# Patient Record
Sex: Male | Born: 1950 | Race: Black or African American | Hispanic: No | Marital: Single | Smoking: Never smoker
Health system: Southern US, Community
[De-identification: ages and names within clinical notes are randomized; demographics above are authoritative.]

## PROBLEM LIST (undated history)

## (undated) DIAGNOSIS — I10 Essential (primary) hypertension: Secondary | ICD-10-CM

## (undated) DIAGNOSIS — N400 Enlarged prostate without lower urinary tract symptoms: Secondary | ICD-10-CM

## (undated) HISTORY — PX: TONSILLECTOMY: SUR1361

## (undated) HISTORY — PX: CHOLECYSTECTOMY: SHX55

---

## 2008-02-10 ENCOUNTER — Encounter: Admission: RE | Admit: 2008-02-10 | Discharge: 2008-02-10 | Payer: Self-pay | Admitting: Chiropractic Medicine

## 2008-02-12 ENCOUNTER — Ambulatory Visit (HOSPITAL_COMMUNITY): Admission: RE | Admit: 2008-02-12 | Discharge: 2008-02-12 | Payer: Self-pay | Admitting: Chiropractic Medicine

## 2008-07-02 ENCOUNTER — Encounter: Admission: RE | Admit: 2008-07-02 | Discharge: 2008-07-02 | Payer: Self-pay | Admitting: Unknown Physician Specialty
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
## 2010-05-14 ENCOUNTER — Encounter: Payer: Self-pay | Admitting: Unknown Physician Specialty

## 2013-11-25 ENCOUNTER — Encounter (HOSPITAL_COMMUNITY): Payer: Self-pay | Admitting: Emergency Medicine

## 2013-11-25 ENCOUNTER — Emergency Department (HOSPITAL_COMMUNITY)
Admission: EM | Admit: 2013-11-25 | Discharge: 2013-11-25 | Disposition: A | Discharge status: Home / Self Care | Payer: Medicaid Other | Attending: Emergency Medicine | Admitting: Emergency Medicine

## 2013-11-25 DIAGNOSIS — N138 Other obstructive and reflux uropathy: Secondary | ICD-10-CM | POA: Insufficient documentation

## 2013-11-25 DIAGNOSIS — I10 Essential (primary) hypertension: Secondary | ICD-10-CM | POA: Diagnosis not present

## 2013-11-25 DIAGNOSIS — R339 Retention of urine, unspecified: Secondary | ICD-10-CM | POA: Insufficient documentation

## 2013-11-25 DIAGNOSIS — N401 Enlarged prostate with lower urinary tract symptoms: Principal | ICD-10-CM

## 2013-11-25 HISTORY — DX: Benign prostatic hyperplasia without lower urinary tract symptoms: N40.0

## 2013-11-25 HISTORY — DX: Essential (primary) hypertension: I10

## 2013-11-25 LAB — I-STAT CHEM 8, ED
BUN: 9 mg/dL (ref 6–23)
CHLORIDE: 101 meq/L (ref 96–112)
Calcium, Ion: 1.08 mmol/L — ABNORMAL LOW (ref 1.13–1.30)
Creatinine, Ser: 1.1 mg/dL (ref 0.50–1.35)
GLUCOSE: 101 mg/dL — AB (ref 70–99)
HEMATOCRIT: 50 % (ref 39.0–52.0)
HEMOGLOBIN: 17 g/dL (ref 13.0–17.0)
Potassium: 3.2 mEq/L — ABNORMAL LOW (ref 3.7–5.3)
Sodium: 137 mEq/L (ref 137–147)
TCO2: 20 mmol/L (ref 0–100)

## 2013-11-25 LAB — URINALYSIS, ROUTINE W REFLEX MICROSCOPIC
Bilirubin Urine: NEGATIVE
Glucose, UA: NEGATIVE mg/dL
Ketones, ur: NEGATIVE mg/dL
LEUKOCYTES UA: NEGATIVE
NITRITE: NEGATIVE
PROTEIN: NEGATIVE mg/dL
SPECIFIC GRAVITY, URINE: 1.015 (ref 1.005–1.030)
Urobilinogen, UA: 0.2 mg/dL (ref 0.0–1.0)
pH: 5.5 (ref 5.0–8.0)

## 2013-11-25 LAB — URINE MICROSCOPIC-ADD ON

## 2013-11-25 MED ORDER — LIDOCAINE HCL 2 % EX GEL
1.0000 "application " | Freq: Once | CUTANEOUS | Status: AC
  Administered 2013-11-25: 1 via URETHRAL
  Filled 2013-11-25: qty 20

## 2013-11-25 NOTE — ED Notes (Signed)
Per pt sts that he has been unable to void since this am. sts severe pain in groin area and penis. sts he feels like the urine goes to the tip of his penis and then stops. Pt very diaphoretic and rapid respirations.

## 2013-11-25 NOTE — Discharge Instructions (Signed)
Call your urologist to schedule follow-up in the next few days. Return to the ED for new concerns.

## 2013-11-25 NOTE — ED Provider Notes (Signed)
CSN: [id number]     Arrival date & time 11/25/13  1427 History   First MD Initiated Contact with Patient 11/25/13 1500     Chief Complaint  Patient presents with  . Urine Output     (Consider location/radiation/quality/duration/timing/severity/associated sxs/prior Treatment) The history is provided by the patient and medical records.   This is a 63 year old male with past medical history significant for hypertension enlarged prostate, presenting to the ED for urinary retention. Patient states he is less able to urinate around BM this morning. States he has a severe pain in his suprapubic region-- states "it feels like my bladder is going to explode".  Patient constantly has the sensation to urinate, but is unable to produce any urine. He has never had issues like this in the past will required catheterization.  Denies any recent flank pain, fever, chills, or sweats. No back pain, LE weakness, numbness, or paresthesias.  No difficulties ambulating.  Patient has no prior history of kidney stones.  Pt is followed by urology regularly at the [facility] in [place] by Dr. Eunice Blase.  Past Medical History  Diagnosis Date  . Enlarged prostate   . Hypertension    Past Surgical History  Procedure Laterality Date  . Cholecystectomy    . Tonsillectomy     History reviewed. No pertinent family history. History  Substance Use Topics  . Smoking status: Never Smoker   . Smokeless tobacco: Not on file  . Alcohol Use: No    Review of Systems  Genitourinary: Positive for difficulty urinating.  All other systems reviewed and are negative.     Allergies  Influenza vaccines and Statins  Home Medications   Prior to Admission medications   Not on File   BP 204/120  Pulse 93  Temp(Src) 98.3 F (36.8 C)  Resp 30  Wt 287 lb (130.182 kg)  SpO2 99%  Physical Exam  Nursing note and vitals reviewed. Constitutional: He is oriented to person, place, and time. He appears well-developed and  well-nourished.  Appears uncomfortable  HENT:  Head: Normocephalic and atraumatic.  Mouth/Throat: Oropharynx is clear and moist.  Eyes: Conjunctivae and EOM are normal. Pupils are equal, round, and reactive to light.  Neck: Normal range of motion.  Cardiovascular: Normal rate, regular rhythm and normal heart sounds.   Pulmonary/Chest: Effort normal and breath sounds normal. No respiratory distress. He has no wheezes.  Abdominal: Soft. Bowel sounds are normal. There is no tenderness. There is no guarding and no CVA tenderness.  "pressure" over suprapubic region but no focal tenderness or peritoneal signs  Genitourinary: Testes normal and penis normal. Circumcised.  Musculoskeletal: Normal range of motion.  Neurological: He is alert and oriented to person, place, and time.  AAOx3, answering questions appropriately; equal strength UE and LE bilaterally; CN grossly intact; moves all extremities appropriately without ataxia; no focal neuro deficits or facial asymmetry appreciated  Skin: Skin is warm and dry.  Psychiatric: He has a normal mood and affect.    ED Course  Procedures (including critical care time) Labs Review Labs Reviewed  URINALYSIS, ROUTINE W REFLEX MICROSCOPIC - Abnormal; Notable for the following:    Hgb urine dipstick LARGE (*)    All other components within normal limits  I-STAT CHEM 8, ED - Abnormal; Notable for the following:    Potassium 3.2 (*)    Glucose, Bld 101 (*)    Calcium, Ion 1.08 (*)    All other components within normal limits  URINE CULTURE  URINE MICROSCOPIC-ADD ON    Imaging Review No results found.   EKG Interpretation None      MDM   Final diagnoses:  Urinary retention   63 y.o. M with known enlarged prostate presenting with urinary retention.  Denies fever, chills, sweats, or hx kidney stones.  Pt followed by urology regularly at [facility], last seen on Thursday 11/19/13.  Bladder scan with 700+ cc urine in bladder.  Will place foley  cathter, obtain u/a and chem-8.  Pt hypertensive at this time, likely secondary due to pain.  Foley placed with 800+ cc return of yellow urine.  Pt states pain has completed resolved, he is feeling great at this time. Abdominal exam benign, no CVA tenderness. U/a without signs of infection, hematuria present likely from cath. Renal function WNL.  BP has returned to normal.  Urinary retention likely secondary to enlarged prostate.  No back pain or LE sx to suggest SCI.  Will leave foley catheter in place.  Pt will FU with his urologist this week.  Discussed plan with patient, he/she acknowledged understanding and agreed with plan of care.  Return precautions given for new or worsening symptoms.  Larene Pickett, PA-C 11/25/13 2330

## 2013-11-25 NOTE — ED Notes (Signed)
Assisted [place], [place] with changing of foley bag to leg bag and instructing pt on how to adjust the straps and empty the leg bag

## 2013-11-26 NOTE — ED Provider Notes (Signed)
Medical screening examination/treatment/procedure(s) were performed by non-physician practitioner and as supervising physician I was immediately available for consultation/collaboration.   EKG Interpretation None        Francine Graven, DO 11/26/13 2342

## 2013-11-27 LAB — URINE CULTURE
COLONY COUNT: NO GROWTH
Culture: NO GROWTH

## 2017-02-08 ENCOUNTER — Observation Stay (HOSPITAL_COMMUNITY)
Admission: EM | Admit: 2017-02-08 | Discharge: 2017-02-10 | Disposition: A | Discharge status: Home / Self Care | Payer: Medicaid Other | Attending: Family Medicine | Admitting: Family Medicine

## 2017-02-08 ENCOUNTER — Encounter (HOSPITAL_COMMUNITY): Payer: Self-pay

## 2017-02-08 DIAGNOSIS — K922 Gastrointestinal hemorrhage, unspecified: Secondary | ICD-10-CM | POA: Diagnosis not present

## 2017-02-08 DIAGNOSIS — Z79899 Other long term (current) drug therapy: Secondary | ICD-10-CM | POA: Insufficient documentation

## 2017-02-08 DIAGNOSIS — Z800 Family history of malignant neoplasm of digestive organs: Secondary | ICD-10-CM | POA: Diagnosis not present

## 2017-02-08 DIAGNOSIS — I10 Essential (primary) hypertension: Secondary | ICD-10-CM | POA: Diagnosis not present

## 2017-02-08 DIAGNOSIS — E876 Hypokalemia: Secondary | ICD-10-CM | POA: Insufficient documentation

## 2017-02-08 DIAGNOSIS — K5790 Diverticulosis of intestine, part unspecified, without perforation or abscess without bleeding: Secondary | ICD-10-CM | POA: Insufficient documentation

## 2017-02-08 DIAGNOSIS — N400 Enlarged prostate without lower urinary tract symptoms: Secondary | ICD-10-CM | POA: Insufficient documentation

## 2017-02-08 DIAGNOSIS — D62 Acute posthemorrhagic anemia: Secondary | ICD-10-CM | POA: Diagnosis not present

## 2017-02-08 DIAGNOSIS — K625 Hemorrhage of anus and rectum: Secondary | ICD-10-CM | POA: Diagnosis present

## 2017-02-08 MED ORDER — SODIUM CHLORIDE 0.9 % IV BOLUS (SEPSIS)
1000.0000 mL | Freq: Once | INTRAVENOUS | Status: AC
  Administered 2017-02-09: 1000 mL via INTRAVENOUS

## 2017-02-08 NOTE — ED Triage Notes (Signed)
Pt from home BIB [facility]. Pt has a Lower GI bleed hasn't had a appetite for a few days. Pt denise N/V or GI pain. Pt states he was going to the bathroom when he sat down and felt the stool very runny. When pt looked back he saw the whole toilet full of blood and some on the floor with clots. Pt is axox4 at this time.

## 2017-02-08 NOTE — ED Provider Notes (Signed)
[facility]Y DEPARTMENT Provider Note   CSN: [id number] Arrival date & time: 02/08/17  2332     History   Chief Complaint Chief Complaint  Patient presents with  . GI Bleeding    HPI Justin Li is a 66 y.o. male.  The history is provided by the patient and the EMS personnel.  Rectal Bleeding  Quality:  Bright red Amount:  Copious Duration:  2 hours Timing:  Intermittent Chronicity:  New Context: defecation   Similar prior episodes: no   Relieved by:  None tried Worsened by:  Defecation Associated symptoms: abdominal pain, dizziness and light-headedness   Associated symptoms: no fever, no hematemesis, no recent illness and no vomiting   Risk factors: no anticoagulant use   patient with h/o HTN, chronic pain presents with rectal bleeding He reports 2 bowel movements tonight that contained large amount of blood He reports feeling lightheaded He is not on anticoagulants No recent travel/antibiotics [facility] Last [facility]oscopy 2 yrs ago and polyps removed at [place]   Past Medical History:  Diagnosis Date  . Enlarged prostate   . Hypertension     There are no active problems to display for this patient.   Past Surgical History:  Procedure Laterality Date  . CHOLECYSTECTOMY    . TONSILLECTOMY         Home Medications    Prior to Admission medications   Medication Sig Start Date End Date Taking? Authorizing Provider  cetirizine (ZYRTEC) 10 MG tablet Take 10 mg by mouth daily as needed for allergies.    [provider]  cyclobenzaprine (FLEXERIL) 10 MG tablet Take 10 mg by mouth 2 (two) times daily.    [provider]  divalproex (DEPAKOTE) 500 MG DR tablet Take 1,000 mg by mouth at bedtime.    [provider]  gabapentin (NEURONTIN) 300 MG capsule Take 600 mg by mouth 2 (two) times daily.    [provider]  hydrochlorothiazide (MICROZIDE) 12.5 MG capsule Take 12.5 mg by mouth  every morning.    [provider]  imipramine (TOFRANIL) 10 MG tablet Take 30 mg by mouth at bedtime.    [provider]  lisinopril (PRINIVIL,ZESTRIL) 20 MG tablet Take 10 mg by mouth every morning.    [provider]  Misc Natural Products (ECHINACEA GOLDENSEAL PLUS) CAPS Take 1 capsule by mouth every morning.    [provider]    Family History No family history on file.  Social History Social History  Substance Use Topics  . Smoking status: Never Smoker  . Smokeless tobacco: Not on file  . Alcohol use No     Allergies   Statins and Influenza vaccines   Review of Systems Review of Systems  Constitutional: Negative for fever.  Respiratory: Positive for shortness of breath. Negative for cough.   Gastrointestinal: Positive for abdominal pain, blood in stool and hematochezia. Negative for hematemesis and vomiting.  Neurological: Positive for dizziness and light-headedness.  All other systems reviewed and are negative.    Physical Exam Updated Vital Signs BP 107/61 (BP Location: Right Arm)   Pulse 63   Temp 98.4 F (36.9 C) (Oral)   Resp 18   Ht 1.803 m (5\' 11" )   Wt 121.6 kg (268 lb)   SpO2 100%   BMI 37.38 kg/m   Physical Exam CONSTITUTIONAL: Well developed/well nourished HEAD: Normocephalic/atraumatic EYES: EOMI/PERRL ENMT: Mucous membranes moist NECK: supple no meningeal signs SPINE/BACK:entire spine nontender CV: S1/S2  noted, no murmurs/rubs/gallops noted LUNGS: Lungs are clear to auscultation bilaterally, no apparent distress ABDOMEN: soft, nontender, no rebound or guarding, bowel sounds noted throughout abdomen, obese GU:no cva tenderness Rectal - nurse present for exam.  Gross blood noted per rectum NEURO: Pt is awake/alert/appropriate, moves all extremitiesx4.  No facial droop.   EXTREMITIES: pulses normal/equal, full ROM, dried blood noted to lower extremities SKIN: warm, color normal PSYCH: no abnormalities of  mood noted, alert and oriented to situation   ED Treatments / Results  Labs (all labs ordered are listed, but only abnormal results are displayed) Labs Reviewed  BASIC METABOLIC PANEL - Abnormal; Notable for the following:       Result Value   Potassium 2.6 (*)    CO2 20 (*)    Glucose, Bld 153 (*)    Creatinine, Ser 1.52 (*)    Calcium 8.3 (*)    GFR calc non Af Amer 47 (*)    GFR calc Af Amer 54 (*)    All other components within normal limits  POC OCCULT BLOOD, ED - Abnormal; Notable for the following:    Fecal Occult Bld POSITIVE (*)    All other components within normal limits  CBC WITH DIFFERENTIAL/PLATELET  HEMOGLOBIN AND HEMATOCRIT, BLOOD  HEMOGLOBIN AND HEMATOCRIT, BLOOD  HEMOGLOBIN AND HEMATOCRIT, BLOOD  TYPE AND SCREEN  ABO/RH    EKG  EKG Interpretation  Date/Time:  Saturday February 09 2017 00:49:44 EDT Ventricular Rate:  67 PR Interval:    QRS Duration: 113 QT Interval:  414 QTC Calculation: 437 R Axis:   32 Text Interpretation:  Sinus rhythm Borderline intraventricular conduction delay Abnormal R-wave progression, early transition No previous ECGs available Confirmed by Ripley Fraise [phone]) on 02/09/2017 1:04:21 AM       Radiology No results found.  Procedures Procedures (including critical care time)  Medications Ordered in ED Medications  potassium chloride 10 mEq in 100 mL IVPB (not administered)  magnesium sulfate IVPB 2 g 50 mL (2 g Intravenous New Bag/Given 02/09/17 0123)  potassium chloride 10 MEQ/100ML IVPB (not administered)  potassium chloride SA (K-DUR,KLOR-CON) CR tablet 40 mEq (not administered)  topiramate (TOPAMAX) capsule 75 mg (not administered)  gabapentin (NEURONTIN) capsule 600 mg (not administered)  imipramine (TOFRANIL) tablet 30 mg (not administered)  loratadine (CLARITIN) tablet 10 mg (not administered)  cyclobenzaprine (FLEXERIL) tablet 10 mg (not administered)  sodium chloride 0.9 % bolus 1,000 mL (0 mLs  Intravenous Stopped 02/09/17 0123)     Initial Impression / Assessment and Plan / ED Course  I have reviewed the triage vital signs and the nursing notes.  Pertinent labs   results that were available during my care of the patient were reviewed by me and considered in my medical decision making (see chart for details).     Pt stable BP appropriate HGB stable Will need admission for GI bleed and hypokalemia D/w dr gardner for admission   Final Clinical Impressions(s) / ED Diagnoses   Final diagnoses:  Acute GI bleeding  Hypokalemia    New Prescriptions New Prescriptions   No medications on file     Ripley Fraise, MD 02/09/17 [phone]

## 2017-02-09 DIAGNOSIS — K922 Gastrointestinal hemorrhage, unspecified: Secondary | ICD-10-CM

## 2017-02-09 DIAGNOSIS — K625 Hemorrhage of anus and rectum: Secondary | ICD-10-CM | POA: Diagnosis present

## 2017-02-09 DIAGNOSIS — D62 Acute posthemorrhagic anemia: Secondary | ICD-10-CM | POA: Diagnosis not present

## 2017-02-09 DIAGNOSIS — E875 Hyperkalemia: Secondary | ICD-10-CM | POA: Diagnosis not present

## 2017-02-09 DIAGNOSIS — E876 Hypokalemia: Secondary | ICD-10-CM

## 2017-02-09 DIAGNOSIS — I10 Essential (primary) hypertension: Secondary | ICD-10-CM | POA: Diagnosis present

## 2017-02-09 LAB — BASIC METABOLIC PANEL
ANION GAP: 8 (ref 5–15)
Anion gap: 9 (ref 5–15)
BUN: 20 mg/dL (ref 6–20)
BUN: 20 mg/dL (ref 6–20)
CALCIUM: 7.9 mg/dL — AB (ref 8.9–10.3)
CHLORIDE: 108 mmol/L (ref 101–111)
CO2: 20 mmol/L — AB (ref 22–32)
CO2: 20 mmol/L — ABNORMAL LOW (ref 22–32)
CREATININE: 1.52 mg/dL — AB (ref 0.61–1.24)
Calcium: 8.3 mg/dL — ABNORMAL LOW (ref 8.9–10.3)
Chloride: 108 mmol/L (ref 101–111)
Creatinine, Ser: 1.31 mg/dL — ABNORMAL HIGH (ref 0.61–1.24)
GFR calc Af Amer: 54 mL/min — ABNORMAL LOW (ref >60)
GFR calc non Af Amer: 47 mL/min — ABNORMAL LOW (ref >60)
GFR, EST NON AFRICAN AMERICAN: 56 mL/min — AB (ref >60)
GLUCOSE: 124 mg/dL — AB (ref 65–99)
GLUCOSE: 153 mg/dL — AB (ref 65–99)
Potassium: 2.6 mmol/L — CL (ref 3.5–5.1)
Potassium: 3.1 mmol/L — ABNORMAL LOW (ref 3.5–5.1)
SODIUM: 136 mmol/L (ref 135–145)
Sodium: 137 mmol/L (ref 135–145)

## 2017-02-09 LAB — HEMOGLOBIN AND HEMATOCRIT, BLOOD
HCT: 38.3 % — ABNORMAL LOW (ref 39.0–52.0)
HEMATOCRIT: 37.6 % — AB (ref 39.0–52.0)
HEMATOCRIT: 38.1 % — AB (ref 39.0–52.0)
HEMOGLOBIN: 12.4 g/dL — AB (ref 13.0–17.0)
Hemoglobin: 13.2 g/dL (ref 13.0–17.0)
Hemoglobin: 12.9 g/dL — ABNORMAL LOW (ref 13.0–17.0)

## 2017-02-09 LAB — TYPE AND SCREEN
ABO/RH(D): O POS
Antibody Screen: NEGATIVE

## 2017-02-09 LAB — CBC WITH DIFFERENTIAL/PLATELET
Basophils Absolute: 0 10*3/uL (ref 0.0–0.1)
Basophils Relative: 1 %
EOS ABS: 0.3 10*3/uL (ref 0.0–0.7)
EOS PCT: 4 %
HCT: 41.5 % (ref 39.0–52.0)
HEMOGLOBIN: 14 g/dL (ref 13.0–17.0)
LYMPHS ABS: 2.7 10*3/uL (ref 0.7–4.0)
Lymphocytes Relative: 32 %
MCH: 30.2 pg (ref 26.0–34.0)
MCHC: 33.7 g/dL (ref 30.0–36.0)
MCV: 89.6 fL (ref 78.0–100.0)
MONOS PCT: 11 %
Monocytes Absolute: 0.9 10*3/uL (ref 0.1–1.0)
NEUTROS PCT: 52 %
Neutro Abs: 4.5 10*3/uL (ref 1.7–7.7)
Platelets: 259 10*3/uL (ref 150–400)
RBC: 4.63 MIL/uL (ref 4.22–5.81)
RDW: 14.4 % (ref 11.5–15.5)
WBC: 8.5 10*3/uL (ref 4.0–10.5)

## 2017-02-09 LAB — MAGNESIUM: Magnesium: 2.1 mg/dL (ref 1.7–2.4)

## 2017-02-09 LAB — ABO/RH: ABO/RH(D): O POS

## 2017-02-09 LAB — APTT: aPTT: 31 seconds (ref 24–36)

## 2017-02-09 LAB — HIV ANTIBODY (ROUTINE TESTING W REFLEX): HIV SCREEN 4TH GENERATION: NONREACTIVE

## 2017-02-09 LAB — PROTIME-INR
INR: 1.11
Prothrombin Time: 14.2 seconds (ref 11.4–15.2)

## 2017-02-09 LAB — POC OCCULT BLOOD, ED: Fecal Occult Bld: POSITIVE — AB

## 2017-02-09 MED ORDER — ONDANSETRON HCL 4 MG/2ML IJ SOLN: 4.0000 mg | Freq: Four times a day (QID) | INTRAMUSCULAR | Status: DC | PRN

## 2017-02-09 MED ORDER — TOPIRAMATE 25 MG PO TABS
75.0000 mg | ORAL_TABLET | Freq: Two times a day (BID) | ORAL | Status: DC
  Administered 2017-02-09 – 2017-02-10 (×3): 75 mg via ORAL
  Filled 2017-02-09 (×3): qty 3

## 2017-02-09 MED ORDER — ACETAMINOPHEN 650 MG RE SUPP: 650.0000 mg | Freq: Four times a day (QID) | RECTAL | Status: DC | PRN

## 2017-02-09 MED ORDER — CYCLOBENZAPRINE HCL 10 MG PO TABS
10.0000 mg | ORAL_TABLET | Freq: Two times a day (BID) | ORAL | Status: DC
  Administered 2017-02-09 – 2017-02-10 (×3): 10 mg via ORAL
  Filled 2017-02-09 (×3): qty 1

## 2017-02-09 MED ORDER — LORATADINE 10 MG PO TABS
10.0000 mg | ORAL_TABLET | Freq: Every day | ORAL | Status: DC
  Administered 2017-02-09 – 2017-02-10 (×2): 10 mg via ORAL
  Filled 2017-02-09 (×2): qty 1

## 2017-02-09 MED ORDER — ACETAMINOPHEN 325 MG PO TABS: 650.0000 mg | ORAL_TABLET | Freq: Four times a day (QID) | ORAL | Status: DC | PRN

## 2017-02-09 MED ORDER — IMIPRAMINE HCL 10 MG PO TABS
30.0000 mg | ORAL_TABLET | Freq: Every day | ORAL | Status: DC
  Administered 2017-02-09: 30 mg via ORAL
  Filled 2017-02-09 (×4): qty 3

## 2017-02-09 MED ORDER — POTASSIUM CHLORIDE 10 MEQ/100ML IV SOLN
INTRAVENOUS | Status: AC
  Administered 2017-02-09: 10 meq via INTRAVENOUS
  Filled 2017-02-09: qty 100

## 2017-02-09 MED ORDER — POTASSIUM CHLORIDE 10 MEQ/100ML IV SOLN
10.0000 meq | INTRAVENOUS | Status: AC
  Administered 2017-02-09 (×3): 10 meq via INTRAVENOUS
  Filled 2017-02-09 (×3): qty 100

## 2017-02-09 MED ORDER — ONDANSETRON HCL 4 MG PO TABS: 4.0000 mg | ORAL_TABLET | Freq: Four times a day (QID) | ORAL | Status: DC | PRN

## 2017-02-09 MED ORDER — MAGNESIUM SULFATE 2 GM/50ML IV SOLN
INTRAVENOUS | Status: AC
  Filled 2017-02-09: qty 50

## 2017-02-09 MED ORDER — MAGNESIUM SULFATE 2 GM/50ML IV SOLN
2.0000 g | Freq: Once | INTRAVENOUS | Status: AC
  Administered 2017-02-09: 2 g via INTRAVENOUS

## 2017-02-09 MED ORDER — POTASSIUM CHLORIDE 10 MEQ/100ML IV SOLN
INTRAVENOUS | Status: AC
  Filled 2017-02-09: qty 100

## 2017-02-09 MED ORDER — POTASSIUM CHLORIDE 10 MEQ/100ML IV SOLN
10.0000 meq | Freq: Once | INTRAVENOUS | Status: AC
  Administered 2017-02-09: 10 meq via INTRAVENOUS

## 2017-02-09 MED ORDER — POTASSIUM CHLORIDE CRYS ER 20 MEQ PO TBCR
40.0000 meq | EXTENDED_RELEASE_TABLET | Freq: Once | ORAL | Status: AC
  Administered 2017-02-09: 40 meq via ORAL
  Filled 2017-02-09: qty 2

## 2017-02-09 MED ORDER — GABAPENTIN 300 MG PO CAPS
600.0000 mg | ORAL_CAPSULE | Freq: Two times a day (BID) | ORAL | Status: DC
  Administered 2017-02-09 – 2017-02-10 (×3): 600 mg via ORAL
  Filled 2017-02-09 (×3): qty 2

## 2017-02-09 NOTE — ED Notes (Signed)
ED Provider at bedside. 

## 2017-02-09 NOTE — H&P (Signed)
History and Physical    Justin Li YQI:[id number] DOB: Jan 26, 1951 DOA: 02/08/2017  PCP: Charlsie Merles, MD  Patient coming from: Home  I have personally briefly reviewed patient's old medical records in [place]  Chief Complaint: BRBPR  HPI: Justin Li is a 66 y.o. male with medical history significant of BPH, HTN.  Patient presents to the ED with c/o 2 bloody BMs this evening.  Onset 2 hours PTA.  2 total episodes thus far.  Bright red blood with clots.  Last colonoscopy was 2 years ago, gets colonoscopy every 3 years at [place] due to family history of colon cancer.  No N/V.  Not on anticoagulants.   ED Course: HGB 14.  Creat 1.5.  K 2.6.   Review of Systems: As per HPI otherwise 10 point review of systems negative.   Past Medical History:  Diagnosis Date  . Enlarged prostate   . Hypertension     Past Surgical History:  Procedure Laterality Date  . CHOLECYSTECTOMY    . TONSILLECTOMY       reports that he has never smoked. He has never used smokeless tobacco. He reports that he does not drink alcohol or use drugs.  Allergies  Allergen Reactions  . Statins Shortness Of Breath, Nausea Only and Other (See Comments)    Myalgias and weakness also  . Influenza Vaccines Other (See Comments)    Causes bad flu symptoms  . Ibuprofen Nausea And Vomiting    History reviewed. No pertinent family history. Family history significant for colorectal cancer.  Prior to Admission medications   Medication Sig Start Date End Date Taking? Authorizing Provider  cetirizine (ZYRTEC) 10 MG tablet Take 10 mg by mouth daily as needed for allergies.   Yes [provider]  cyclobenzaprine (FLEXERIL) 10 MG tablet Take 10 mg by mouth 2 (two) times daily.   Yes [provider]  gabapentin (NEURONTIN) 300 MG capsule Take 600 mg by mouth 2 (two) times daily.   Yes [provider]  hydrochlorothiazide (MICROZIDE) 12.5 MG capsule Take 12.5 mg by mouth  every morning.   Yes [provider]  imipramine (TOFRANIL) 10 MG tablet Take 30 mg by mouth at bedtime.   Yes [provider]  lisinopril (PRINIVIL,ZESTRIL) 20 MG tablet Take 10 mg by mouth every morning.   Yes [provider]  naproxen sodium (ANAPROX) 220 MG tablet Take 220 mg by mouth daily as needed (for pain).   Yes [provider]  tamsulosin (FLOMAX) 0.4 MG CAPS capsule Take 0.4 mg by mouth daily after supper.   Yes [provider]  topiramate (TOPAMAX) 25 MG capsule Take 75 mg by mouth 2 (two) times daily.   Yes [provider]    Physical Exam: Vitals:   02/08/17 2345 02/09/17 0030 02/09/17 0100 02/09/17 0115  BP:  101/60 104/71 103/65  Pulse:  70 68   Resp:  (!) 22 18 17   Temp:      TempSrc:      SpO2: 100% 99% 98%   Weight: 121.6 kg (268 lb)     Height: 5\' 11"  (1.803 m)       Constitutional: NAD, calm, comfortable Eyes: PERRL, lids and conjunctivae normal ENMT: Mucous membranes are moist. Posterior pharynx clear of any exudate or lesions.Normal dentition.  Neck: normal, supple, no masses, no thyromegaly Respiratory: clear to auscultation bilaterally, no wheezing, no crackles. Normal respiratory effort. No accessory muscle use.  Cardiovascular: Regular rate and rhythm, no murmurs /  rubs / gallops. No extremity edema. 2+ pedal pulses. No carotid bruits.  Abdomen: no tenderness, no masses palpated. No hepatosplenomegaly. Bowel sounds positive.  Musculoskeletal: no clubbing / cyanosis. No joint deformity upper and lower extremities. Good ROM, no contractures. Normal muscle tone.  Skin: no rashes, lesions, ulcers. No induration Neurologic: CN 2-12 grossly intact. Sensation intact, DTR normal. Strength 5/5 in all 4.  Psychiatric: Normal judgment and insight. Alert and oriented x 3. Normal mood.    Labs on Admission: I have personally reviewed following labs and imaging studies  CBC:  Recent Labs Lab 02/09/17 0002    WBC 8.5  NEUTROABS 4.5  HGB 14.0  HCT 41.5  MCV 89.6  PLT 409   Basic Metabolic Panel:  Recent Labs Lab 02/09/17 0002  NA 137  K 2.6*  CL 108  CO2 20*  GLUCOSE 153*  BUN 20  CREATININE 1.52*  CALCIUM 8.3*   GFR: Estimated Creatinine Clearance: 65.1 mL/min (A) (by C-G formula based on SCr of 1.52 mg/dL (H)). Liver Function Tests: No results for input(s): AST, ALT, ALKPHOS, BILITOT, PROT, ALBUMIN in the last 168 hours. No results for input(s): LIPASE, AMYLASE in the last 168 hours. No results for input(s): AMMONIA in the last 168 hours. Coagulation Profile: No results for input(s): INR, PROTIME in the last 168 hours. Cardiac Enzymes: No results for input(s): CKTOTAL, CKMB, CKMBINDEX, TROPONINI in the last 168 hours. BNP (last 3 results) No results for input(s): PROBNP in the last 8760 hours. HbA1C: No results for input(s): HGBA1C in the last 72 hours. CBG: No results for input(s): GLUCAP in the last 168 hours. Lipid Profile: No results for input(s): CHOL, HDL, LDLCALC, TRIG, CHOLHDL, LDLDIRECT in the last 72 hours. Thyroid Function Tests: No results for input(s): TSH, T4TOTAL, FREET4, T3FREE, THYROIDAB in the last 72 hours. Anemia Panel: No results for input(s): VITAMINB12, FOLATE, FERRITIN, TIBC, IRON, RETICCTPCT in the last 72 hours. Urine analysis:    Component Value Date/Time   COLORURINE YELLOW 11/25/2013 1521   APPEARANCEUR CLEAR 11/25/2013 1521   LABSPEC 1.015 11/25/2013 1521   PHURINE 5.5 11/25/2013 1521   GLUCOSEU NEGATIVE 11/25/2013 1521   HGBUR LARGE (A) 11/25/2013 1521   BILIRUBINUR NEGATIVE 11/25/2013 1521   KETONESUR NEGATIVE 11/25/2013 1521   PROTEINUR NEGATIVE 11/25/2013 1521   UROBILINOGEN 0.2 11/25/2013 1521   NITRITE NEGATIVE 11/25/2013 1521   LEUKOCYTESUR NEGATIVE 11/25/2013 1521    Radiological Exams on Admission: No results found.  EKG: Independently reviewed.  Assessment/Plan Principal Problem:   BRBPR (bright red blood per  rectum) Active Problems:   HTN (hypertension)    1. BRBPR - probably diverticular bleed 1. Q6H H/H 2. Tele monitor 3. Check PT-INR and APTT 4. Call GI in AM 1. Last colonoscopy 2 years ago, typically has colonoscopies every 3 years due to strong family history of colorectal cancer.  Sees GI at the [place]. 2. HTN - 1. BP on the soft side (though patient states he usually runs 100-110) 2. Will hold BP meds for now given GIB  DVT prophylaxis: SCDs Code Status: Full Family Communication: Family at bedisde Disposition Plan: Home after admit Consults called: None Admission status: Place in [place], [facility] Pager [phone]  If 7AM-7PM, please contact day team taking care of patient [email or web address] Password [email or web address]  02/09/2017, 2:08 AM

## 2017-02-09 NOTE — Progress Notes (Signed)
[facility]  Patient admitted after midnight see H&P for further details.   Patient seen and examined, report bloody bowel movement overnight.  Today seems to have slowed down.  Patient denies abdominal pain,  nausea, and diarrhea.  GI was consulted.  GI bleed -likely lower from diverticular disease GI recommendations appreciated Continue to monitor H&H If increased bleeding will get a CT angiogram or nuclear scan  Rest per H&P   Chipper Oman, MD

## 2017-02-09 NOTE — Consult Note (Signed)
Referring Provider:  Dr. Quincy Simmonds, [facility] Primary Care Physician:  Charlsie Merles, MD Primary Gastroenterologist:  Althia Forts, has GI MD at [facility]  Reason for Consultation:  GI bleed  HPI: Justin Li is a 66 y.o. male with medical history significant of BPH, HTN.  Patient presents to the ED with c/o 2 bloody BMs on the evening of admission.  This was described as bright red blood with clots.  Last colonoscopy was 2 years ago (he says 02/2015), gets colonoscopy every 3 years at [place] due to family history of colon cancer.  No N/V.  No abdominal pain.  Not on anticoagulants.  Rare naproxen use, maybe 3 times per month.  Hgb was 14.0 grams on admission and is 12.4 grams this AM.  He tells me that he has not been to the bathroom since he got to his room in the night.  Tried to stand up once at the bedside to use the urinal and got dizzy.     Past Medical History:  Diagnosis Date  . Enlarged prostate   . Hypertension     Past Surgical History:  Procedure Laterality Date  . CHOLECYSTECTOMY    . TONSILLECTOMY      Prior to Admission medications   Medication Sig Start Date End Date Taking? Authorizing Provider  cetirizine (ZYRTEC) 10 MG tablet Take 10 mg by mouth daily as needed for allergies.   Yes [provider]  cyclobenzaprine (FLEXERIL) 10 MG tablet Take 10 mg by mouth 2 (two) times daily.   Yes [provider]  gabapentin (NEURONTIN) 300 MG capsule Take 600 mg by mouth 2 (two) times daily.   Yes [provider]  hydrochlorothiazide (MICROZIDE) 12.5 MG capsule Take 12.5 mg by mouth every morning.   Yes [provider]  imipramine (TOFRANIL) 10 MG tablet Take 30 mg by mouth at bedtime.   Yes [provider]  lisinopril (PRINIVIL,ZESTRIL) 20 MG tablet Take 10 mg by mouth every morning.   Yes [provider]  naproxen sodium (ANAPROX) 220 MG tablet Take 220 mg by mouth daily as needed (for pain).   Yes [provider]    tamsulosin (FLOMAX) 0.4 MG CAPS capsule Take 0.4 mg by mouth daily after supper.   Yes [provider]  topiramate (TOPAMAX) 25 MG capsule Take 75 mg by mouth 2 (two) times daily.   Yes [provider]    Current Facility-Administered Medications  Medication Dose Route Frequency Provider Last Rate Last Dose  . acetaminophen (TYLENOL) tablet 650 mg  650 mg Oral Q6H PRN Etta Quill, DO       Or  . acetaminophen (TYLENOL) suppository 650 mg  650 mg Rectal Q6H PRN Etta Quill, DO      . cyclobenzaprine (FLEXERIL) tablet 10 mg  10 mg Oral BID Jennette Kettle M, DO   10 mg at 02/09/17 1105  . gabapentin (NEURONTIN) capsule 600 mg  600 mg Oral BID Jennette Kettle M, DO   600 mg at 02/09/17 1105  . imipramine (TOFRANIL) tablet 30 mg  30 mg Oral QHS Jennette Kettle M, DO      . loratadine (CLARITIN) tablet 10 mg  10 mg Oral Daily Jennette Kettle M, DO   10 mg at 02/09/17 1106  . ondansetron (ZOFRAN) tablet 4 mg  4 mg Oral Q6H PRN Etta Quill, DO       Or  . ondansetron [facility]) injection 4 mg  4 mg Intravenous Q6H PRN  Etta Quill, DO      . topiramate (TOPAMAX) tablet 75 mg  75 mg Oral BID Etta Quill, DO   75 mg at 02/09/17 1105    Allergies as of 02/08/2017 - Review Complete 02/08/2017  Allergen Reaction Noted  . Statins Shortness Of Breath, Nausea Only, and Other (See Comments) 11/25/2013  . Influenza vaccines Other (See Comments) 11/25/2013    History reviewed. No pertinent family history.  Social History   Social History  . Marital status: Single    Spouse name: N/A  . Number of children: N/A  . Years of education: N/A   Occupational History  . Not on file.   Social History Main Topics  . Smoking status: Never Smoker  . Smokeless tobacco: Never Used  . Alcohol use No  . Drug use: No  . Sexual activity: Not on file   Other Topics Concern  . Not on file   Social History Narrative  . No narrative on file    Review of Systems: ROS  is O/W negative except as mentioned in HPI.  Physical Exam: Vital signs in last 24 hours: Temp:  [98 F (36.7 C)-98.4 F (36.9 C)] 98 F (36.7 C) (10/20 0548) Pulse Rate:  [63-76] 69 (10/20 0548) Resp:  [14-22] 18 (10/20 0548) BP: (101-125)/(47-74) 102/47 (10/20 0548) SpO2:  [95 %-100 %] 98 % (10/20 0548) Weight:  [268 lb (121.6 kg)-282 lb 9.6 oz (128.2 kg)] 282 lb 9.6 oz (128.2 kg) (10/20 0548) Last BM Date: 02/08/17 General:  Alert, Well-developed, well-nourished, pleasant and cooperative in NAD Head:  Normocephalic and atraumatic. Eyes:  Sclera clear, no icterus.  Conjunctiva pink. Ears:  Normal auditory acuity. Mouth:  No deformity or lesions.   Lungs:  Clear throughout to auscultation.  No wheezes, crackles, or rhonchi.  Heart:  Regular rate and rhythm; no murmurs, clicks, rubs, or gallops. Abdomen:  Soft, obese, non-distended.  BS present.  Non-tender. Msk:  Symmetrical without gross deformities. Pulses:  Normal pulses noted. Extremities:  Without clubbing or edema. Neurologic:  Alert and oriented x 4;  grossly normal neurologically. Skin:  Intact without significant lesions or rashes. Psych:  Alert and cooperative. Normal mood and affect.  Intake/Output from previous day: 10/19 0701 - 10/20 0700 In: 1150 [IV Piggyback:1150] Out: -  Intake/Output this shift: Total I/O In: 240 [P.O.:240] Out: 400 [Urine:400]  Lab Results:  Recent Labs  02/09/17 0002 02/09/17 0401 02/09/17 1127  WBC 8.5  --   --   HGB 14.0 13.2 12.4*  HCT 41.5 38.3* 37.6*  PLT 259  --   --    BMET  Recent Labs  02/09/17 0002 02/09/17 0401  NA 137 136  K 2.6* 3.1*  CL 108 108  CO2 20* 20*  GLUCOSE 153* 124*  BUN 20 20  CREATININE 1.52* 1.31*  CALCIUM 8.3* 7.9*   PT/INR  Recent Labs  02/09/17 0002  LABPROT 14.2  INR 1.11    IMPRESSION:  *GI bleed:  Highly suspicious for diverticular bleed.  Reports colonoscopy at [facility] 2 years ago with a couple polyps ad diverticulosis.  At  this point the bleeding has seemed to be slowing down significantly.   *ABLA:  Hgb down slightly at 12.4 gram, which may also be somewhat dilutional. *Hypokalemia:  Replacement per primary service.  PLAN: *Will observe for now.  Monitor Hgb and transfuse prn.  ZEHR, JESSICA D.  02/09/2017, 12:04 PM  Pager number [phone]   ________________________________________________________________________  Velora Heckler GI MD note:  I personally examined the patient, reviewed the data and agree with the assessment and plan described above.  Most likely he's had lower gi bleeding from diverticulosis. Seems to have stopped already. Should observe for re-bleeding. Clears today and advance to regular diet tomorrow if no bleeding overnight. Will follow.   Owens Loffler, MD [facility] Pager [phone]

## 2017-02-09 NOTE — ED Notes (Signed)
Delay in lab draw,  Admitting MD at bedside. 

## 2017-02-10 DIAGNOSIS — K625 Hemorrhage of anus and rectum: Secondary | ICD-10-CM | POA: Diagnosis not present

## 2017-02-10 DIAGNOSIS — E875 Hyperkalemia: Secondary | ICD-10-CM

## 2017-02-10 DIAGNOSIS — D62 Acute posthemorrhagic anemia: Secondary | ICD-10-CM

## 2017-02-10 DIAGNOSIS — K922 Gastrointestinal hemorrhage, unspecified: Secondary | ICD-10-CM | POA: Diagnosis not present

## 2017-02-10 LAB — HEMOGLOBIN AND HEMATOCRIT, BLOOD
HCT: 37.6 % — ABNORMAL LOW (ref 39.0–52.0)
HCT: 39 % (ref 39.0–52.0)
HCT: 39.3 % (ref 39.0–52.0)
HEMOGLOBIN: 13.1 g/dL (ref 13.0–17.0)
Hemoglobin: 12.6 g/dL — ABNORMAL LOW (ref 13.0–17.0)
Hemoglobin: 13.2 g/dL (ref 13.0–17.0)

## 2017-02-10 MED ORDER — POTASSIUM CHLORIDE 10 MEQ/100ML IV SOLN
10.0000 meq | INTRAVENOUS | Status: AC
  Administered 2017-02-10 (×2): 10 meq via INTRAVENOUS
  Filled 2017-02-10 (×2): qty 100

## 2017-02-10 NOTE — Progress Notes (Signed)
Cardiac monitor discontinued as per DR.Silva  verbal order.

## 2017-02-10 NOTE — Discharge Summary (Signed)
Physician Discharge Summary  Justin Li  XBM:[id number]  DOB: 1950/10/18  DOA: 02/08/2017 PCP: Charlsie Merles, MD  Admit date: 02/08/2017 Discharge date: 02/10/2017  Admitted From: Home  Disposition:  Home   Recommendations for Outpatient Follow-up:  1. Follow up with PCP in 1-2 weeks 2. Follow up with GI at [facility] hospital  3. Please obtain BMP/CBC in one week to monitor renal function and hemoglobin  Discharge Condition: Stable CODE STATUS: Full code Diet recommendation: Heart Healthy/soft diet   Brief/Interim Summary: 66 year old male with past medical history of BPH, hypertension presented to the emergency room complaining of 2 bloody bowel movements.  Prior blood with clots.  Patient was admitted for elevation of GI bleed.  Hemoglobin was trending which remained somewhat stable.  GI was consulted and decided to monitor and no interventions at this moment.  Resolved patient diet was advanced tolerating well.  Bloody stools patient was cleared by GI for discharge to follow-up with GI as an outpatient.  Subjective: Patient seen and examined, doing well.  Drinking and tolerating diet.  Denies abdominal pain.  No more bloody bowel movement.  Discharge Diagnoses/Hospital Course:  GI bleed -felt to be lower from possible diverticular disease Hemoglobin was monitored and was relative stable GI was consulted decided to treat conservatively Patient had colonoscopy 2 years ago 2 polyps were removed which were benign per patient.  Advised to follow-up with GI at the [facility].  Hypertension Blood pressure was stable during hospital stay No changes in medication will remain  Hypokalemia IV potassium was given Check potassium levels in 1 week.  All other chronic medical condition were stable during the hospitalization.  On the day of the discharge the patient's vitals were stable, and no other acute medical condition were reported by patient. the patient was felt safe to be  discharge to home   Discharge Instructions  You were cared for by a hospitalist during your hospital stay. If you have any questions about your discharge medications or the care you received while you were in the hospital after you are discharged, you can call the unit and asked to speak with the hospitalist on call if the hospitalist that took care of you is not available. Once you are discharged, your primary care physician will handle any further medical issues. Please note that NO REFILLS for any discharge medications will be authorized once you are discharged, as it is imperative that you return to your primary care physician (or establish a relationship with a primary care physician if you do not have one) for your aftercare needs so that they can reassess your need for medications and monitor your lab values.  Discharge Instructions    Call MD for:  difficulty breathing, headache or visual disturbances    Complete by:  As directed    Call MD for:  extreme fatigue    Complete by:  As directed    Call MD for:  hives    Complete by:  As directed    Call MD for:  persistant dizziness or light-headedness    Complete by:  As directed    Call MD for:  persistant nausea and vomiting    Complete by:  As directed    Call MD for:  redness, tenderness, or signs of infection (pain, swelling, redness, odor or green/yellow discharge around incision site)    Complete by:  As directed    Call MD for:  severe uncontrolled pain    Complete by:  As  directed    Call MD for:  temperature >100.4    Complete by:  As directed    Diet - low sodium heart healthy    Complete by:  As directed    Increase activity slowly    Complete by:  As directed      Allergies as of 02/10/2017      Reactions   Statins Shortness Of Breath, Nausea Only, Other (See Comments)   Myalgias and weakness also   Influenza Vaccines Other (See Comments)   Causes bad flu symptoms   Ibuprofen Nausea And Vomiting      Medication  List    STOP taking these medications   naproxen sodium 220 MG tablet Commonly known as:  ANAPROX     TAKE these medications   cetirizine 10 MG tablet Commonly known as:  ZYRTEC Take 10 mg by mouth daily as needed for allergies.   cyclobenzaprine 10 MG tablet Commonly known as:  FLEXERIL Take 10 mg by mouth 2 (two) times daily.   gabapentin 300 MG capsule Commonly known as:  NEURONTIN Take 600 mg by mouth 2 (two) times daily.   hydrochlorothiazide 12.5 MG capsule Commonly known as:  MICROZIDE Take 12.5 mg by mouth every morning.   imipramine 10 MG tablet Commonly known as:  TOFRANIL Take 30 mg by mouth at bedtime.   lisinopril 20 MG tablet Commonly known as:  PRINIVIL,ZESTRIL Take 10 mg by mouth every morning.   tamsulosin 0.4 MG Caps capsule Commonly known as:  FLOMAX Take 0.4 mg by mouth daily after supper.   topiramate 25 MG capsule Commonly known as:  TOPAMAX Take 75 mg by mouth 2 (two) times daily.      Follow-up Information    Borum, Jaci Standard, MD. Schedule an appointment as soon as possible for a visit in 1 week(s).   Specialty:  Internal Medicine Why:  Hospital follow up  Contact information: [place] [address] [phone]          Allergies  Allergen Reactions  . Statins Shortness Of Breath, Nausea Only and Other (See Comments)    Myalgias and weakness also  . Influenza Vaccines Other (See Comments)    Causes bad flu symptoms  . Ibuprofen Nausea And Vomiting    Consultations:  GI - Dr Ardis Hughs    Procedures/Studies: No results found.  Discharge Exam: Vitals:   02/10/17 0825 02/10/17 1348  BP:  107/78  Pulse:  66  Resp:  18  Temp:  98.2 F (36.8 C)  SpO2: 96% 100%   Vitals:   02/10/17 0510 02/10/17 0600 02/10/17 0825 02/10/17 1348  BP: (!) 83/38 110/60  107/78  Pulse: 63   66  Resp:    18  Temp:    98.2 F (36.8 C)  TempSrc:    Oral  SpO2:   96% 100%  Weight:      Height:         General: Pt is alert, awake, not in acute distress Cardiovascular: RRR, S1/S2 +, no rubs, no gallops Respiratory: CTA bilaterally, no wheezing, no rhonchi Abdominal: Soft, NT, ND, bowel sounds + Extremities: no edema, no cyanosis   The results of significant diagnostics from this hospitalization (including imaging, microbiology, ancillary and laboratory) are listed below for reference.     Microbiology: No results found for this or any previous visit (from the past 240 hour(s)).   Labs: BNP (last 3 results) No results for input(s): BNP in the last 8760  hours. Basic Metabolic Panel:  Recent Labs Lab 02/09/17 0002 02/09/17 0401 02/09/17 1127  NA 137 136  --   K 2.6* 3.1*  --   CL 108 108  --   CO2 20* 20*  --   GLUCOSE 153* 124*  --   BUN 20 20  --   CREATININE 1.52* 1.31*  --   CALCIUM 8.3* 7.9*  --   MG  --   --  2.1   Liver Function Tests: No results for input(s): AST, ALT, ALKPHOS, BILITOT, PROT, ALBUMIN in the last 168 hours. No results for input(s): LIPASE, AMYLASE in the last 168 hours. No results for input(s): AMMONIA in the last 168 hours. CBC:  Recent Labs Lab 02/09/17 0002  02/09/17 1127 02/09/17 1801 02/10/17 0014 02/10/17 0804 02/10/17 1125  WBC 8.5  --   --   --   --   --   --   NEUTROABS 4.5  --   --   --   --   --   --   HGB 14.0  < > 12.4* 12.9* 13.2 13.1 12.6*  HCT 41.5  < > 37.6* 38.1* 39.3 39.0 37.6*  MCV 89.6  --   --   --   --   --   --   PLT 259  --   --   --   --   --   --   < > = values in this interval not displayed. Cardiac Enzymes: No results for input(s): CKTOTAL, CKMB, CKMBINDEX, TROPONINI in the last 168 hours. BNP: Invalid input(s): POCBNP CBG: No results for input(s): GLUCAP in the last 168 hours. D-Dimer No results for input(s): DDIMER in the last 72 hours. Hgb A1c No results for input(s): HGBA1C in the last 72 hours. Lipid Profile No results for input(s): CHOL, HDL, LDLCALC, TRIG, CHOLHDL, LDLDIRECT in the last 72  hours. Thyroid function studies No results for input(s): TSH, T4TOTAL, T3FREE, THYROIDAB in the last 72 hours.  Invalid input(s): FREET3 Anemia work up No results for input(s): VITAMINB12, FOLATE, FERRITIN, TIBC, IRON, RETICCTPCT in the last 72 hours. Urinalysis    Component Value Date/Time   COLORURINE YELLOW 11/25/2013 1521   APPEARANCEUR CLEAR 11/25/2013 1521   LABSPEC 1.015 11/25/2013 1521   PHURINE 5.5 11/25/2013 1521   GLUCOSEU NEGATIVE 11/25/2013 1521   HGBUR LARGE (A) 11/25/2013 1521   BILIRUBINUR NEGATIVE 11/25/2013 1521   KETONESUR NEGATIVE 11/25/2013 1521   PROTEINUR NEGATIVE 11/25/2013 1521   UROBILINOGEN 0.2 11/25/2013 1521   NITRITE NEGATIVE 11/25/2013 1521   LEUKOCYTESUR NEGATIVE 11/25/2013 1521   Sepsis Labs Invalid input(s): PROCALCITONIN,  WBC,  LACTICIDVEN Microbiology No results found for this or any previous visit (from the past 240 hour(s)).   Time coordinating discharge: 32 minutes  SIGNED:  Chipper Oman, MD  [facility] 02/10/2017, 3:34 PM  Pager please text page via  [email or web address] Password [email or web address]

## 2017-02-10 NOTE — Progress Notes (Signed)
     [facility] Progress Note  CC:  GI bleed  Subjective:  No further sign of bleeding.  Just had a BM that was dark brown in color, no sign of blood.  Objective:  Vital signs in last 24 hours: Temp:  [97.7 F (36.5 C)-98.7 F (37.1 C)] 97.7 F (36.5 C) (10/21 0508) Pulse Rate:  [63-73] 63 (10/21 0510) Resp:  [17-18] 17 (10/21 0508) BP: (83-110)/(38-60) 110/60 (10/21 0600) SpO2:  [98 %-100 %] 98 % (10/21 0508) Last BM Date: 02/08/17 General:  Alert, Well-developed, in NAD Heart:  Regular rate and rhythm; no murmurs Pulm:  CTAB.  No increased WOB. Abdomen:  Soft, non-distended.  BS present.  Non-tender.   Extremities:  Without edema. Neurologic:  Alert and  oriented x 4;  grossly normal neurologically. Psych:  Alert and cooperative. Normal mood and affect.  Lab Results:  Recent Labs  02/09/17 0002  02/09/17 1801 02/10/17 0014 02/10/17 0804  WBC 8.5  --   --   --   --   HGB 14.0  < > 12.9* 13.2 13.1  HCT 41.5  < > 38.1* 39.3 39.0  PLT 259  --   --   --   --   < > = values in this interval not displayed. BMET  Recent Labs  02/09/17 0002 02/09/17 0401  NA 137 136  K 2.6* 3.1*  CL 108 108  CO2 20* 20*  GLUCOSE 153* 124*  BUN 20 20  CREATININE 1.52* 1.31*  CALCIUM 8.3* 7.9*   PT/INR  Recent Labs  02/09/17 0002  LABPROT 14.2  INR 1.11   Assessment / Plan: *GI bleed:  Highly suspicious for diverticular bleed.  Reports colonoscopy at [facility] 2 years ago with a couple polyps ad diverticulosis.  At this point the bleeding has stopped with no sign of blood in over 36 hours.   *ABLA:  Hgb stable at 13.1 grams this AM. *Hypokalemia:  K+ 3.1 this AM.  Replacement per primary service.  **Will let him eat a soft diet for lunch and if all goes well then can go home this afternoon from a GI standpoint.  He can follow-up with his GI at the [place].   LOS: 0 days   ZEHR, JESSICA D.  02/10/2017, 9:06 AM  Pager number  [phone]  ________________________________________________________________________  Velora Heckler GI MD note:  I personally examined the patient, reviewed the data and agree with the assessment and plan described above. Bleeding was low volume and has clearly stopped.  Follow up PRN with his [place] gastroenterologist.  Please call or page with any further questions or concerns.    Owens Loffler, MD [facility] Pager [phone]

## 2017-02-10 NOTE — Progress Notes (Signed)
NURSING PROGRESS NOTE  Justin Li [id number] Discharge Data: 02/10/2017 7:32 PM Attending Provider: No att. providers found TAV:WPVXY, Jaci Standard, MD   Pavle Ben-Israel to be D/C'd Home per MD order.    All IV's will be discontinued and monitored for bleeding.  All belongings will be returned to patient for patient to take home.  Last Documented Vital Signs:  Blood pressure 107/78, pulse 66, temperature 98.2 F (36.8 C), temperature source Oral, resp. rate 18, height 5\' 11"  (1.803 m), weight 128.2 kg (282 lb 9.6 oz), SpO2 100 %.  Talbert Forest RN

## 2018-02-25 ENCOUNTER — Encounter: Payer: Self-pay | Admitting: Gastroenterology

## 2018-03-07 ENCOUNTER — Ambulatory Visit (INDEPENDENT_AMBULATORY_CARE_PROVIDER_SITE_OTHER): Payer: Medicare Other | Admitting: Gastroenterology

## 2018-03-07 ENCOUNTER — Telehealth: Payer: Self-pay | Admitting: Gastroenterology

## 2018-03-07 ENCOUNTER — Encounter: Payer: Self-pay | Admitting: Gastroenterology

## 2018-03-07 VITALS — BP 126/70 | HR 68 | Ht 69.0 in | Wt 258.0 lb

## 2018-03-07 DIAGNOSIS — D369 Benign neoplasm, unspecified site: Secondary | ICD-10-CM

## 2018-03-07 DIAGNOSIS — Z800 Family history of malignant neoplasm of digestive organs: Secondary | ICD-10-CM | POA: Diagnosis not present

## 2018-03-07 MED ORDER — NA SULFATE-K SULFATE-MG SULF 17.5-3.13-1.6 GM/177ML PO SOLN: 1.0000 | Freq: Once | ORAL | 0 refills | Status: AC

## 2018-03-07 NOTE — Progress Notes (Signed)
Chief Complaint: Colon Cancer screening   Referring Provider:     Ria Clock, PA    HPI:     Justin Li is a 67 y.o. male with a history of hypertension, hyperlipidemia, headaches referred from the [place] to the [facility]ology Clinic for evaluation of ongoing colon cancer screening/polyp surveillance.  Family history notable for mother with colon cancer diagnosed approx 57 yo and sister with colon cancer diagnosed approx 65 yo.  He presents with records from the [place], which are reviewed today. Last colonoscopy was in 02/2015 and n/f multiple adenomatous polyps, to include a 10 to 20 mm tubular adenoma resected from the ileocecal valve, with recommendation repeat in 3 years for ongoing surveillance.  He states he has intentionally lost 46# since Feb with dietary modifications and attending Nutrition class at the [place] and yoga. He is otherwise without any GI symptoms, to include abdominal pain, hematochezia, melena, n/v/d/c/f/c, night sweats.  No upper GI sxs, to include reflux, dysphagia, odynophagia, n/v.  He is otherwise in his usual state of health, tolerating all p.o. intake and medications as prescribed.  He is without any complaints today.  Endoscopic Hx: - Colonsocopy 03/2005 ([facility]): Normal; recommended repeat in 10 years - Colonoscopy 02/2015: Multiple TAs- 4 polyps, 5-10 mm in sigmoid (HP), transverse colon (TA), cecum (TA) and another 10-20 mm polyp at ICV (TA).   Past Medical History:  Diagnosis Date  . Enlarged prostate   . Hypertension      Past Surgical History:  Procedure Laterality Date  . CHOLECYSTECTOMY    . TONSILLECTOMY     No family history on file. Social History   Tobacco Use  . Smoking status: Never Smoker  . Smokeless tobacco: Never Used  Substance Use Topics  . Alcohol use: No  . Drug use: No   Current Outpatient Medications  Medication Sig Dispense Refill  . cetirizine (ZYRTEC) 10 MG tablet Take 10 mg by mouth daily  as needed for allergies.    . cyclobenzaprine (FLEXERIL) 10 MG tablet Take 10 mg by mouth 2 (two) times daily.    Marland Kitchen gabapentin (NEURONTIN) 300 MG capsule Take 600 mg by mouth 2 (two) times daily.    . hydrochlorothiazide (MICROZIDE) 12.5 MG capsule Take 12.5 mg by mouth every morning.    Marland Kitchen imipramine (TOFRANIL) 10 MG tablet Take 30 mg by mouth at bedtime.    Marland Kitchen lisinopril (PRINIVIL,ZESTRIL) 20 MG tablet Take 10 mg by mouth every morning.    . tamsulosin (FLOMAX) 0.4 MG CAPS capsule Take 0.4 mg by mouth daily after supper.    . topiramate (TOPAMAX) 25 MG capsule Take 75 mg by mouth 2 (two) times daily.     No current facility-administered medications for this visit.    Allergies  Allergen Reactions  . Statins Shortness Of Breath, Nausea Only and Other (See Comments)    Myalgias and weakness also  . Influenza Vaccines Other (See Comments)    Causes bad flu symptoms  . Ibuprofen Nausea And Vomiting     Review of Systems: All systems reviewed and negative except where noted in HPI.     Physical Exam:    Wt Readings from Last 3 Encounters:  02/09/17 282 lb 9.6 oz (128.2 kg)  11/25/13 287 lb (130.2 kg)    There were no vitals taken for this visit. Constitutional:  Pleasant, in no acute distress. Psychiatric: Normal mood and affect. Behavior is  normal. EENT: Pupils normal.  Conjunctivae are normal. No scleral icterus. Neck supple. No cervical LAD. Cardiovascular: Normal rate, regular rhythm. No edema Pulmonary/chest: Effort normal and breath sounds normal. No wheezing, rales or rhonchi. Abdominal: Soft, nondistended, nontender. Bowel sounds active throughout. There are no masses palpable. No hepatomegaly. Neurological: Alert and oriented to person place and time. Skin: Skin is warm and dry. No rashes noted.   ASSESSMENT AND PLAN;   Justin Li is a 67 y.o. male presenting with a family history of colon cancer and a personal history of large tubular adenomas.  1) Polyp  surveillance: Last colonoscopy was in 02/2015 and notable for 3 tubular adenomas, including a 10 to 20 mm adenoma resected from the ileocecal valve, with appropriate recommendation for repeat colonoscopy in 3 years for ongoing surveillance.  He is otherwise without GI symptoms, does not take any systemic anticoagulation or antiplatelets.  - Scheduled for colonoscopy for ongoing surveillance at this time  2) Family history of colon cancer: Family history of mother (age 56) and sister (age 66) with colon cancer.  Will plan for colonoscopy for surveillance as above.  The indications, risks, and benefits of colonoscopy were explained to the patient in detail. Risks include but are not limited to bleeding, perforation, adverse reaction to medications, and cardiopulmonary compromise. Sequelae include but are not limited to the possibility of surgery, hospitalization, and mortality. The patient verbalized understanding and wished to proceed. All questions answered, referred to the scheduler and bowel prep ordered. Further recommendations pending results of the exam.    Lavena Bullion, DO, FACG  03/07/2018, 8:27 AM   Everhart, Norva Riffle, PA*

## 2018-03-07 NOTE — Patient Instructions (Addendum)
If you are age 65 or older, your body mass index should be between 23-30. Your Body mass index is 38.1 kg/m. If this is out of the aforementioned range listed, please consider follow up with your Primary Care Provider.  If you are age 63 or younger, your body mass index should be between 19-25. Your Body mass index is 38.1 kg/m. If this is out of the aformentioned range listed, please consider follow up with your Primary Care Provider.   You stated you would call us back to schedule your Colonoscopy when you have your Calendar present. [phone]  You have been give a sample of  Suprep  Please purchase the following medications over the counter and take as directed: Ducolax  TWO DAYS BEFORE PROCEDURE: Take 2 ducolax tablets in the morning and again in the evening.  It was a pleasure to see you today!  Vito Cirigliano, D.O.

## 2018-03-11 ENCOUNTER — Encounter: Payer: Self-pay | Admitting: Gastroenterology

## 2018-03-11 ENCOUNTER — Ambulatory Visit (AMBULATORY_SURGERY_CENTER): Payer: Medicare Other | Admitting: Gastroenterology

## 2018-03-11 VITALS — BP 127/83 | HR 83 | Temp 95.9°F | Resp 18 | Ht 69.0 in | Wt 258.0 lb

## 2018-03-11 DIAGNOSIS — D124 Benign neoplasm of descending colon: Secondary | ICD-10-CM | POA: Diagnosis not present

## 2018-03-11 DIAGNOSIS — D129 Benign neoplasm of anus and anal canal: Secondary | ICD-10-CM

## 2018-03-11 DIAGNOSIS — Z86010 Personal history of colonic polyps: Secondary | ICD-10-CM | POA: Diagnosis not present

## 2018-03-11 DIAGNOSIS — D369 Benign neoplasm, unspecified site: Secondary | ICD-10-CM

## 2018-03-11 DIAGNOSIS — D128 Benign neoplasm of rectum: Secondary | ICD-10-CM

## 2018-03-11 DIAGNOSIS — Z800 Family history of malignant neoplasm of digestive organs: Secondary | ICD-10-CM

## 2018-03-11 DIAGNOSIS — D122 Benign neoplasm of ascending colon: Secondary | ICD-10-CM | POA: Diagnosis not present

## 2018-03-11 DIAGNOSIS — K5730 Diverticulosis of large intestine without perforation or abscess without bleeding: Secondary | ICD-10-CM

## 2018-03-11 DIAGNOSIS — K640 First degree hemorrhoids: Secondary | ICD-10-CM

## 2018-03-11 MED ORDER — SODIUM CHLORIDE 0.9 % IV SOLN: 500.0000 mL | Freq: Once | INTRAVENOUS | Status: DC

## 2018-03-11 NOTE — Op Note (Addendum)
[facility] Patient Name: Justin Li Procedure Date: 03/11/2018 2:17 PM MRN: [id number] Endoscopist: Gerrit Heck , MD Age: 67 Referring MD:  Date of Birth: 01/23/1951 Gender: Male Account #: [id number] Procedure:                Colonoscopy Indications:              Surveillance: Personal history of adenomatous                            polyps on last colonoscopy 3 years ago, High risk                            colon cancer surveillance: Personal history of                            adenoma (10 mm or greater in size) Medicines:                Monitored Anesthesia Care Procedure:                Pre-Anesthesia Assessment:                           - Prior to the procedure, a History and Physical                            was performed, and patient medications and                            allergies were reviewed. The patient's tolerance of                            previous anesthesia was also reviewed. The risks                            and benefits of the procedure and the sedation                            options and risks were discussed with the patient.                            All questions were answered, and informed consent                            was obtained. Prior Anticoagulants: The patient has                            taken no previous anticoagulant or antiplatelet                            agents. ASA Grade Assessment: II - A patient with                            mild systemic disease. After reviewing the risks  and benefits, the patient was deemed in                            satisfactory condition to undergo the procedure.                           After obtaining informed consent, the colonoscope                            was passed under direct vision. Throughout the                            procedure, the patient's blood pressure, pulse, and                            oxygen saturations were  monitored continuously. The                            Colonoscope was introduced through the anus and                            advanced to the the cecum, identified by                            appendiceal orifice and ileocecal valve. The                            colonoscopy was performed without difficulty. The                            patient tolerated the procedure well. The quality                            of the bowel preparation was adequate. Scope In: 2:20:40 PM Scope Out: 2:41:10 PM Scope Withdrawal Time: 0 hours 16 minutes 22 seconds  Total Procedure Duration: 0 hours 20 minutes 30 seconds  Findings:                 The perianal and digital rectal examinations were                            normal.                           A 8 mm polyp was found in the ascending colon. The                            polyp was sessile with an apparent scar in the                            center. The polyp was removed with a hot snare.                            Resection and retrieval were complete. Estimated  blood loss was minimal.                           Two sessile polyps were found in the rectum and                            descending colon. The polyps were 3 to 5 mm in                            size. These polyps were removed with a cold snare.                            Resection and retrieval were complete. Estimated                            blood loss was minimal.                           A few small-mouthed diverticula were found in the                            sigmoid colon.                           A few small angioectasias without bleeding were                            found in the cecum.                           Retroflexion in the right colon was performed.                           Non-bleeding internal hemorrhoids were found during                            retroflexion. The hemorrhoids were small. Complications:             No immediate complications. Estimated Blood Loss:     Estimated blood loss was minimal. Impression:               - One 8 mm polyp in the ascending colon, removed                            with a hot snare. Resected and retrieved.                           - Two 3 to 5 mm polyps in the rectum and in the                            descending colon, removed with a cold snare.                            Resected and retrieved.                           -  Diverticulosis in the sigmoid colon.                           - A few non-bleeding colonic angioectasias.                           - Non-bleeding internal hemorrhoids. Recommendation:           - Patient has a contact number available for                            emergencies. The signs and symptoms of potential                            delayed complications were discussed with the                            patient. Return to normal activities tomorrow.                            Written discharge instructions were provided to the                            patient.                           - Resume previous diet today.                           - Continue present medications.                           - Await pathology results.                           - Repeat colonoscopy in 3 years for surveillance.                           - Return to GI office PRN. Gerrit Heck, MD 03/11/2018 2:48:31 PM

## 2018-03-11 NOTE — Progress Notes (Signed)
Pt's states no medical or surgical changes since previsit or office visit. 

## 2018-03-11 NOTE — Progress Notes (Signed)
Called to room to assist during endoscopic procedure.  Patient ID and intended procedure confirmed with present staff. Received instructions for my participation in the procedure from the performing physician.  

## 2018-03-11 NOTE — Progress Notes (Signed)
Spontaneous respirations throughout. VSS. Resting comfortably. To PACU on room air. Report to  RN. 

## 2018-03-11 NOTE — Patient Instructions (Signed)
   INFORMATION ON POLYPS ,DIVERTICULOSIS AND HEMORRHOIDS GIVEN TO YOU TODAY  AWAIT PATHOLOGY RESULTS ON POLYPS REMOVED TODAY   YOU HAD AN ENDOSCOPIC PROCEDURE TODAY AT [facility]:   Refer to the procedure report that was given to you for any specific questions about what was found during the examination.  If the procedure report does not answer your questions, please call your gastroenterologist to clarify.  If you requested that your care partner not be given the details of your procedure findings, then the procedure report has been included in a sealed envelope for you to review at your convenience later.  YOU SHOULD EXPECT: Some feelings of bloating in the abdomen. Passage of more gas than usual.  Walking can help get rid of the air that was put into your GI tract during the procedure and reduce the bloating. If you had a lower endoscopy (such as a colonoscopy or flexible sigmoidoscopy) you may notice spotting of blood in your stool or on the toilet paper. If you underwent a bowel prep for your procedure, you may not have a normal bowel movement for a few days.  Please Note:  You might notice some irritation and congestion in your nose or some drainage.  This is from the oxygen used during your procedure.  There is no need for concern and it should clear up in a day or so.  SYMPTOMS TO REPORT IMMEDIATELY:   Following lower endoscopy (colonoscopy or flexible sigmoidoscopy):  Excessive amounts of blood in the stool  Significant tenderness or worsening of abdominal pains  Swelling of the abdomen that is new, acute  Fever of 100F or higher    For urgent or emergent issues, a gastroenterologist can be reached at any hour by calling [phone].   DIET:  We do recommend a small meal at first, but then you may proceed to your regular diet.  Drink plenty of fluids but you should avoid alcoholic beverages for 24 hours.  ACTIVITY:  You should plan to take it easy for the  rest of today and you should NOT DRIVE or use heavy machinery until tomorrow (because of the sedation medicines used during the test).    FOLLOW UP: Our staff will call the number listed on your records the next business day following your procedure to check on you and address any questions or concerns that you may have regarding the information given to you following your procedure. If we do not reach you, we will leave a message.  However, if you are feeling well and you are not experiencing any problems, there is no need to return our call.  We will assume that you have returned to your regular daily activities without incident.  If any biopsies were taken you will be contacted by phone or by letter within the next 1-3 weeks.  Please call us at [phone] if you have not heard about the biopsies in 3 weeks.    SIGNATURES/CONFIDENTIALITY: You and/or your care partner have signed paperwork which will be entered into your electronic medical record.  These signatures attest to the fact that that the information above on your After Visit Summary has been reviewed and is understood.  Full responsibility of the confidentiality of this discharge information lies with you and/or your care-partner.

## 2018-03-12 ENCOUNTER — Telehealth: Payer: Self-pay

## 2018-03-12 NOTE — Telephone Encounter (Signed)
  Follow up Call-  Call back number 03/11/2018  Post procedure Call Back phone  # [phone] Mr. Drummond's phone  Permission to leave phone message Yes  Some recent data might be hidden     Patient questions:  Do you have a fever, pain , or abdominal swelling? No. Pain Score  0 *  Have you tolerated food without any problems? Yes.    Have you been able to return to your normal activities? Yes.    Do you have any questions about your discharge instructions: Diet   No. Medications  No. Follow up visit  No.  Do you have questions or concerns about your Care? No.  Actions: * If pain score is 4 or above: No action needed, pain <4.

## 2018-03-12 NOTE — Telephone Encounter (Signed)
NO ANSWER, MESSAGE LEFT FOR PATIENT. 

## 2018-03-18 ENCOUNTER — Encounter: Payer: Self-pay | Admitting: Gastroenterology

## 2021-06-21 ENCOUNTER — Encounter: Payer: Self-pay | Admitting: Gastroenterology
